# Patient Record
Sex: Male | Born: 1975 | Race: White | Hispanic: No | Marital: Single | Smoking: Never smoker
Health system: Southern US, Community
[De-identification: ages and names within clinical notes are randomized; demographics above are authoritative.]

## PROBLEM LIST (undated history)

## (undated) DIAGNOSIS — N200 Calculus of kidney: Secondary | ICD-10-CM

---

## 2021-12-17 ENCOUNTER — Emergency Department
Admission: EM | Admit: 2021-12-17 | Discharge: 2021-12-17 | Disposition: A | Discharge status: Home / Self Care | Payer: Commercial Managed Care - PPO | Attending: Emergency Medicine | Admitting: Emergency Medicine

## 2021-12-17 ENCOUNTER — Other Ambulatory Visit: Payer: Self-pay

## 2021-12-17 ENCOUNTER — Emergency Department: Payer: Commercial Managed Care - PPO

## 2021-12-17 DIAGNOSIS — N202 Calculus of kidney with calculus of ureter: Secondary | ICD-10-CM | POA: Diagnosis not present

## 2021-12-17 DIAGNOSIS — R1032 Left lower quadrant pain: Secondary | ICD-10-CM | POA: Diagnosis present

## 2021-12-17 DIAGNOSIS — N200 Calculus of kidney: Secondary | ICD-10-CM

## 2021-12-17 HISTORY — DX: Calculus of kidney: N20.0

## 2021-12-17 LAB — COMPREHENSIVE METABOLIC PANEL
ALT: 32 U/L (ref 0–44)
AST: 19 U/L (ref 15–41)
Albumin: 4.5 g/dL (ref 3.5–5.0)
Alkaline Phosphatase: 73 U/L (ref 38–126)
Anion gap: 9 (ref 5–15)
BUN: 29 mg/dL — ABNORMAL HIGH (ref 6–20)
CO2: 24 mmol/L (ref 22–32)
Calcium: 9 mg/dL (ref 8.9–10.3)
Chloride: 105 mmol/L (ref 98–111)
Creatinine, Ser: 1.04 mg/dL (ref 0.61–1.24)
GFR, Estimated: >60 mL/min (ref >60)
Glucose, Bld: 185 mg/dL — ABNORMAL HIGH (ref 70–99)
Potassium: 3.3 mmol/L — ABNORMAL LOW (ref 3.5–5.1)
Sodium: 138 mmol/L (ref 135–145)
Total Bilirubin: 1.9 mg/dL — ABNORMAL HIGH (ref 0.3–1.2)
Total Protein: 7.4 g/dL (ref 6.5–8.1)

## 2021-12-17 LAB — CBC WITH DIFFERENTIAL/PLATELET
Abs Immature Granulocytes: 0.12 10*3/uL — ABNORMAL HIGH (ref 0.00–0.07)
Basophils Absolute: 0.1 10*3/uL (ref 0.0–0.1)
Basophils Relative: 0 %
Eosinophils Absolute: 0.1 10*3/uL (ref 0.0–0.5)
Eosinophils Relative: 1 %
HCT: 47.6 % (ref 39.0–52.0)
Hemoglobin: 16.5 g/dL (ref 13.0–17.0)
Immature Granulocytes: 1 %
Lymphocytes Relative: 11 %
Lymphs Abs: 1.7 10*3/uL (ref 0.7–4.0)
MCH: 29 pg (ref 26.0–34.0)
MCHC: 34.7 g/dL (ref 30.0–36.0)
MCV: 83.8 fL (ref 80.0–100.0)
Monocytes Absolute: 0.5 10*3/uL (ref 0.1–1.0)
Monocytes Relative: 4 %
Neutro Abs: 12.7 10*3/uL — ABNORMAL HIGH (ref 1.7–7.7)
Neutrophils Relative %: 83 %
Platelets: 358 10*3/uL (ref 150–400)
RBC: 5.68 MIL/uL (ref 4.22–5.81)
RDW: 12.7 % (ref 11.5–15.5)
WBC: 15.3 10*3/uL — ABNORMAL HIGH (ref 4.0–10.5)
nRBC: 0 % (ref 0.0–0.2)

## 2021-12-17 LAB — URINALYSIS, ROUTINE W REFLEX MICROSCOPIC
Bacteria, UA: NONE SEEN
Bilirubin Urine: NEGATIVE
Glucose, UA: NEGATIVE mg/dL
Ketones, ur: NEGATIVE mg/dL
Leukocytes,Ua: NEGATIVE
Nitrite: NEGATIVE
Protein, ur: NEGATIVE mg/dL
Specific Gravity, Urine: >1.046 — ABNORMAL HIGH (ref 1.005–1.030)
pH: 5 (ref 5.0–8.0)

## 2021-12-17 LAB — TYPE AND SCREEN
ABO/RH(D): A POS
Antibody Screen: NEGATIVE

## 2021-12-17 LAB — LACTIC ACID, PLASMA
Lactic Acid, Venous: 2.1 mmol/L (ref 0.5–1.9)
Lactic Acid, Venous: 1.8 mmol/L (ref 0.5–1.9)

## 2021-12-17 MED ORDER — IBUPROFEN 600 MG PO TABS: 600.0000 mg | ORAL_TABLET | Freq: Three times a day (TID) | ORAL | 0 refills | Status: AC | PRN

## 2021-12-17 MED ORDER — ONDANSETRON HCL 4 MG/2ML IJ SOLN
4.0000 mg | Freq: Once | INTRAMUSCULAR | Status: AC
  Administered 2021-12-17: 4 mg via INTRAVENOUS
  Filled 2021-12-17: qty 2

## 2021-12-17 MED ORDER — HYDROMORPHONE HCL 1 MG/ML IJ SOLN
1.0000 mg | Freq: Once | INTRAMUSCULAR | Status: AC
Start: 2021-12-17 — End: 2021-12-17
  Administered 2021-12-17: 1 mg via INTRAVENOUS
  Filled 2021-12-17: qty 1

## 2021-12-17 MED ORDER — ONDANSETRON 4 MG PO TBDP: 4.0000 mg | ORAL_TABLET | Freq: Three times a day (TID) | ORAL | 0 refills | Status: AC | PRN

## 2021-12-17 MED ORDER — IOHEXOL 350 MG/ML SOLN
100.0000 mL | Freq: Once | INTRAVENOUS | Status: AC | PRN
  Administered 2021-12-17: 100 mL via INTRAVENOUS
  Filled 2021-12-17: qty 100

## 2021-12-17 MED ORDER — SODIUM CHLORIDE 0.9 % IV BOLUS
1000.0000 mL | Freq: Once | INTRAVENOUS | Status: AC
  Administered 2021-12-17: 1000 mL via INTRAVENOUS

## 2021-12-17 MED ORDER — KETOROLAC TROMETHAMINE 30 MG/ML IJ SOLN
15.0000 mg | Freq: Once | INTRAMUSCULAR | Status: AC
  Administered 2021-12-17: 15 mg via INTRAVENOUS
  Filled 2021-12-17: qty 1

## 2021-12-17 MED ORDER — HYDROCODONE-ACETAMINOPHEN 5-325 MG PO TABS: 1.0000 | ORAL_TABLET | Freq: Four times a day (QID) | ORAL | 0 refills | Status: AC | PRN

## 2021-12-17 NOTE — ED Provider Notes (Signed)
? ?[facility] ?Provider Note ? ? ? Event Date/Time  ? First MD Initiated Contact with Patient 12/17/21 1251   ?  (approximate) ? ? ?History  ? ?Abdominal Pain (C/o sudden pain to LLQ while having BM, pt receive of fentanyl and 4 mg zofran, pt rating pain 10/10) ? ? ?HPI ? ?Alexander Zavala is a 46 y.o. male here with acute, severe lower abdominal pain.  The patient states he was in his usual state of health until this afternoon, when he experienced acute, severe, left lower quadrant abdominal pain.  It immediately became 10 of 10 severity.  He had associated nausea and diaphoresis.  Pain is severe, the worst he has felt.  Denies any testicular pain or swelling.  No urinary symptoms.  No recent fevers or chills.  No recent trauma. ?  ? ? ?Physical Exam  ? ?Triage Vital Signs: ?ED Triage Vitals  ?Enc Vitals Group  ?   BP --   ?   Pulse --   ?   Resp --   ?   Temp --   ?   Temp src --   ?   SpO2 12/17/21 1244 96 %  ?   Weight 12/17/21 1247 175 lb (79.4 kg)  ?   Height 12/17/21 1247 5\' 9"  (1.753 m)  ?   Head Circumference --   ?   Peak Flow --   ?   Pain Score 12/17/21 1246 10  ?   Pain Loc --   ?   Pain Edu? --   ?   Excl. in GC? --   ? ? ?Most recent vital signs: ?Vitals:  ? 12/17/21 1244 12/17/21 1623  ?BP: (!) 162/101 115/81  ?Pulse: 90 75  ?Resp: (!) 24 18  ?Temp: (!) 97.4 ?F (36.3 ?C) 97.8 ?F (36.6 ?C)  ?SpO2: 96% 95%  ? ? ? ?General: Awake, appears very uncomfortable and in pain. ?CV:  Good peripheral perfusion.  Regular rate and rhythm. ?Resp:  Normal effort.  Lungs clear to auscultation bilaterally. ?Abd:  No distention.  ?Other:  Distressed, significant left lower quadrant tenderness. ? ? ?ED Results / Procedures / Treatments  ? ?Labs ?(all labs ordered are listed, but only abnormal results are displayed) ?Labs Reviewed  ?CBC WITH DIFFERENTIAL/PLATELET - Abnormal; Notable for the following components:  ?    Result Value  ? WBC 15.3 (*)   ? Neutro Abs 12.7 (*)   ? Abs Immature  Granulocytes 0.12 (*)   ? All other components within normal limits  ?COMPREHENSIVE METABOLIC PANEL - Abnormal; Notable for the following components:  ? Potassium 3.3 (*)   ? Glucose, Bld 185 (*)   ? BUN 29 (*)   ? Total Bilirubin 1.9 (*)   ? All other components within normal limits  ?LACTIC ACID, PLASMA - Abnormal; Notable for the following components:  ? Lactic Acid, Venous 2.1 (*)   ? All other components within normal limits  ?URINALYSIS, ROUTINE W REFLEX MICROSCOPIC - Abnormal; Notable for the following components:  ? Color, Urine YELLOW (*)   ? APPearance CLEAR (*)   ? Specific Gravity, Urine >1.046 (*)   ? Hgb urine dipstick LARGE (*)   ? All other components within normal limits  ?LACTIC ACID, PLASMA  ?TYPE AND SCREEN  ? ? ? ?EKG ? ? ? ?RADIOLOGY ?CT dissection study: No dissection, minimal left ureteral dilatation with small calculus in the urinary bladder ? ? ?I also independently reviewed and  agree with radiologist interpretations. ? ? ?PROCEDURES: ? ?Critical Care performed: No ? ? ? ?MEDICATIONS ORDERED IN ED: ?Medications  ?HYDROmorphone (DILAUDID) injection 1 mg (1 mg Intravenous Given 12/17/21 1259)  ?ondansetron (ZOFRAN) injection 4 mg (4 mg Intravenous Given 12/17/21 1259)  ?iohexol (OMNIPAQUE) 350 MG/ML injection 100 mL (100 mLs Intravenous Contrast Given 12/17/21 1415)  ?sodium chloride 0.9 % bolus 1,000 mL (0 mLs Intravenous Stopped 12/17/21 1617)  ?ketorolac (TORADOL) 30 MG/ML injection 15 mg (15 mg Intravenous Given 12/17/21 1517)  ? ? ? ?IMPRESSION / MDM / ASSESSMENT AND PLAN / ED COURSE  ?I reviewed the triage vital signs and the nursing notes. ?             ?               ? ? ?Ddx:  ?Aortic dissection, kidney stone, obstruction, volvulus, diverticulitis, colitis, musculoskeletal pain, radiculopathy ? ? ?MDM:  ?46 year old male with no significant past medical history here with severe left lower quadrant pain.  Patient arrives significantly hypertensive, diaphoretic, and in distress.  States  this feels very different from his kidney stone.  Patient was subsequently sent for emergent CT dissection protocol, given his marked hypertension and atypical symptoms, which fortunately is negative for dissection but does show what appears to be a likely passed stone.  His pain is markedly improved after analgesia and fluids.  Lab work shows moderate leukocytosis which I suspect is reactive.  He is adamant he has had no fevers or chills or infectious symptoms.  CMP is unremarkable with exception of possible mild dehydration with elevated BUN to creatinine ratio.  Lactate 2.1, improved with some fluids.  Urinalysis shows no evidence of UTI.  He has no bacteria.  6010 white blood cells but he has no ongoing symptoms.  This, along with what appears to be a now passed stone, makes UTI/infected stone unlikely.  We will plan to treat for what I suspect was left renal colic secondary to stone which now appears to be passed.  Will give brief analgesics as needed in the event of ongoing pain.  Otherwise, return precautions given. ? ? ?MEDICATIONS GIVEN IN ED: ?Medications  ?HYDROmorphone (DILAUDID) injection 1 mg (1 mg Intravenous Given 12/17/21 1259)  ?ondansetron (ZOFRAN) injection 4 mg (4 mg Intravenous Given 12/17/21 1259)  ?iohexol (OMNIPAQUE) 350 MG/ML injection 100 mL (100 mLs Intravenous Contrast Given 12/17/21 1415)  ?sodium chloride 0.9 % bolus 1,000 mL (0 mLs Intravenous Stopped 12/17/21 1617)  ?ketorolac (TORADOL) 30 MG/ML injection 15 mg (15 mg Intravenous Given 12/17/21 1517)  ? ? ? ?Consults:  ? ? ? ?EMR reviewed  ?None available ? ? ? ? ?FINAL CLINICAL IMPRESSION(S) / ED DIAGNOSES  ? ?Final diagnoses:  ?Nephrolithiasis  ? ? ? ?Rx / DC Orders  ? ?ED Discharge Orders   ? ?      Ordered  ?  ibuprofen (ADVIL) 600 MG tablet  Every 8 hours PRN       ? 12/17/21 1608  ?  ondansetron (ZOFRAN-ODT) 4 MG disintegrating tablet  Every 8 hours PRN       ? 12/17/21 1608  ?  HYDROcodone-acetaminophen (NORCO) 5-325 MG tablet   Every 6 hours PRN       ? 12/17/21 1608  ? ?  ?  ? ?  ? ? ? ?Note:  This document was prepared using Dragon voice recognition software and may include unintentional dictation errors. ?  ?Shaune Pollack, MD ?12/17/21 1814 ? ?

## 2021-12-17 NOTE — ED Notes (Signed)
Pt A&O, IV removed, pt given discharge instructions, follow-up and medications explained, pt able to ambulate with steady gait. ?

## 2023-04-25 IMAGING — CT CT ANGIO CHEST-ABD-PELV FOR DISSECTION W/ AND WO/W CM
2 of 7 series · 14 of 46 positions shown, 16 images · IV contrast (APPLIED)
Comparison: None.

CLINICAL DATA: Right lower quadrant abdominal pain. Acute aortic
syndrome.

EXAM:
CT ANGIOGRAPHY CHEST, ABDOMEN AND PELVIS
TECHNIQUE: Non-contrast CT of the chest was initially obtained.

[Series 7: arterial · axial · arterial · 0.86mm/px · z∈[-947,-279]mm · 11 of 374 slices shown, 13 images]
[im 20/374  soft-tissue]
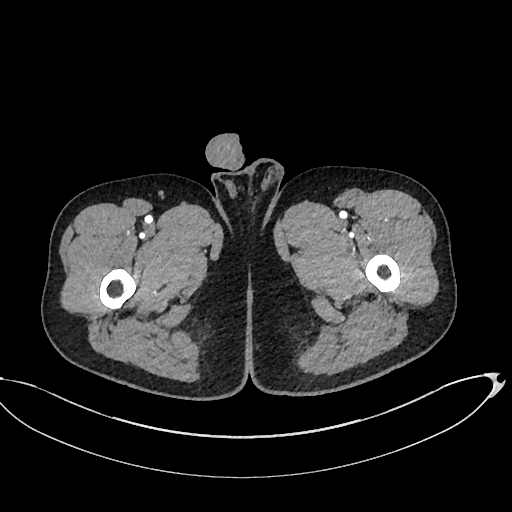
[im 20/374  bone]
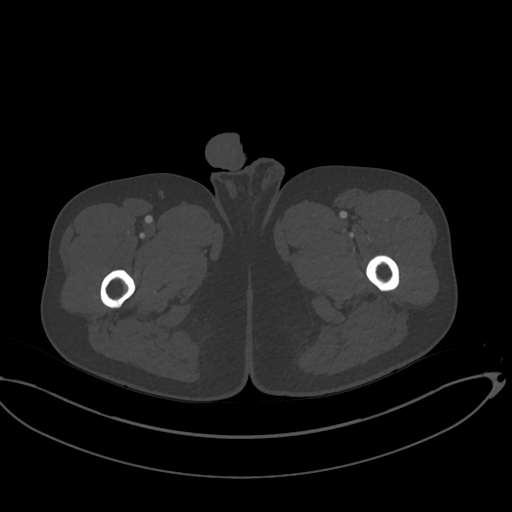
[im 59/374  soft-tissue]
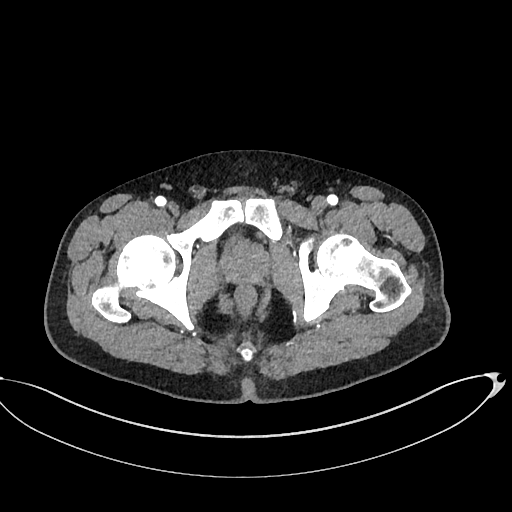
[im 99/374  soft-tissue]
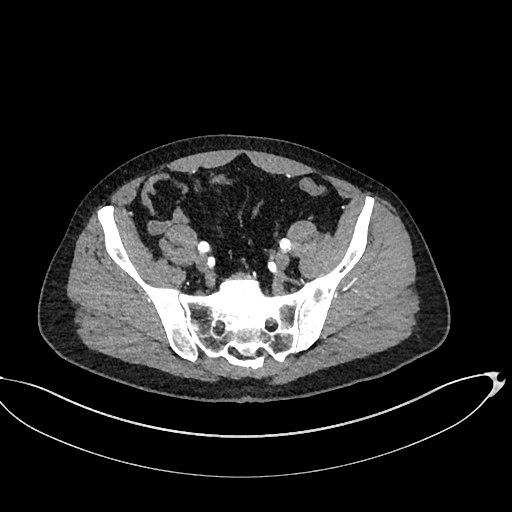
[im 118/374  soft-tissue]
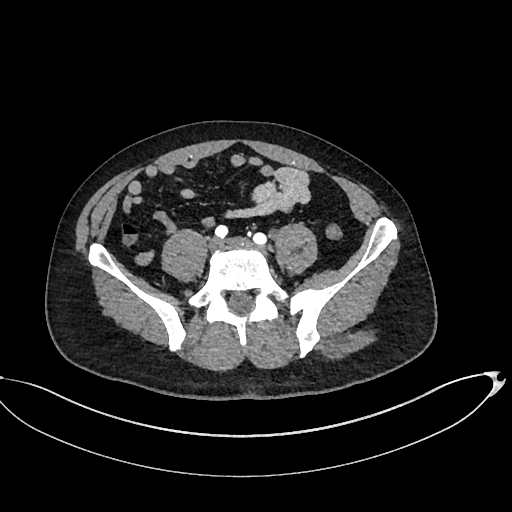
[im 158/374  soft-tissue]
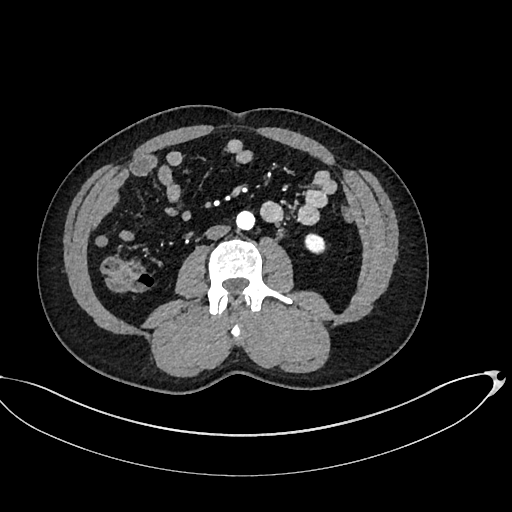
[im 197/374  soft-tissue]
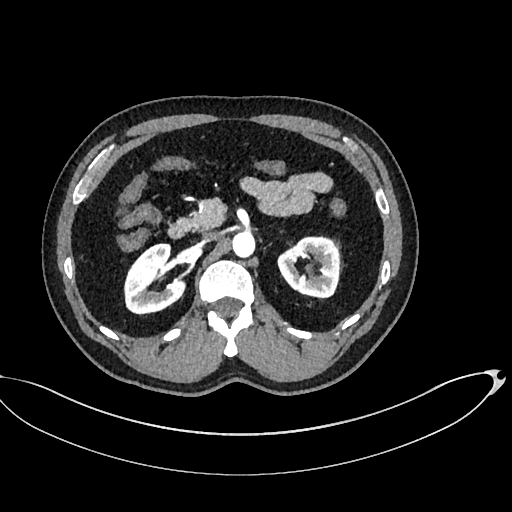
[im 216/374  soft-tissue]
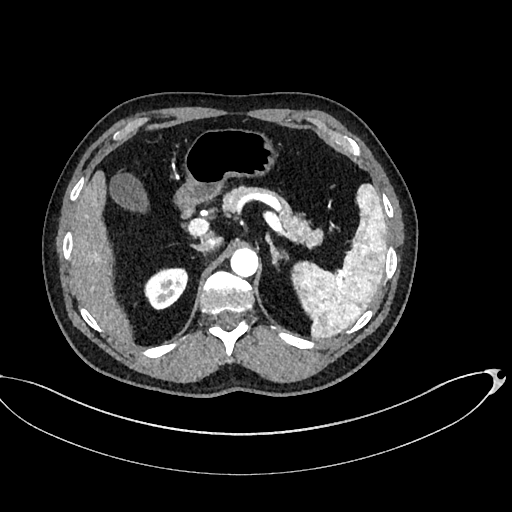
[im 256/374  soft-tissue]
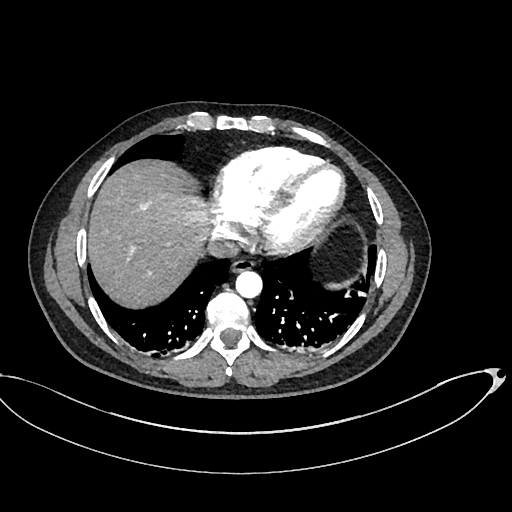
[im 275/374  soft-tissue]
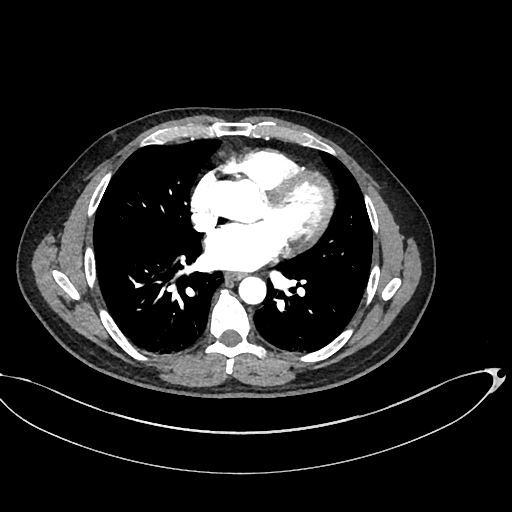
[im 275/374  bone]
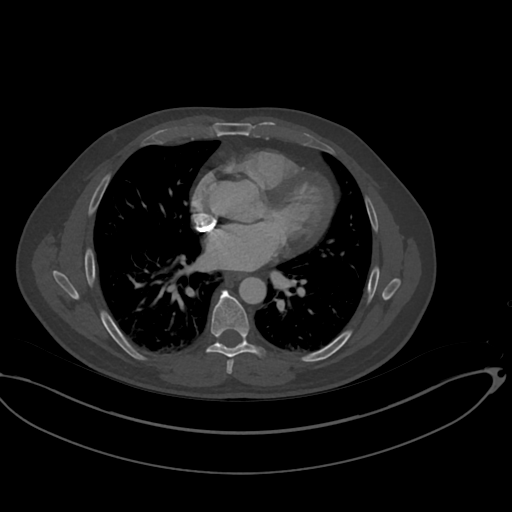
[im 315/374  soft-tissue]
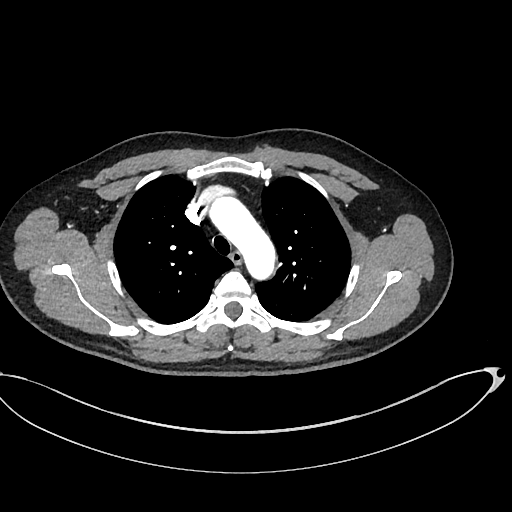
[im 354/374  soft-tissue]
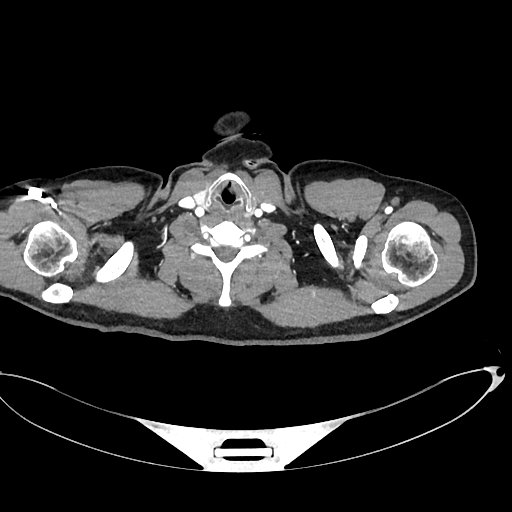

[Series 10: cor · coronal · 1.09mm/px · 3 of 151 slices shown]
[im 38/151  soft-tissue]
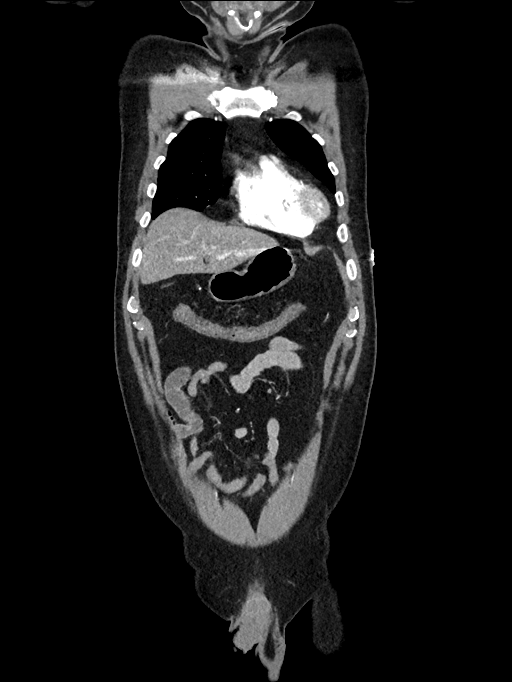
[im 76/151  soft-tissue]
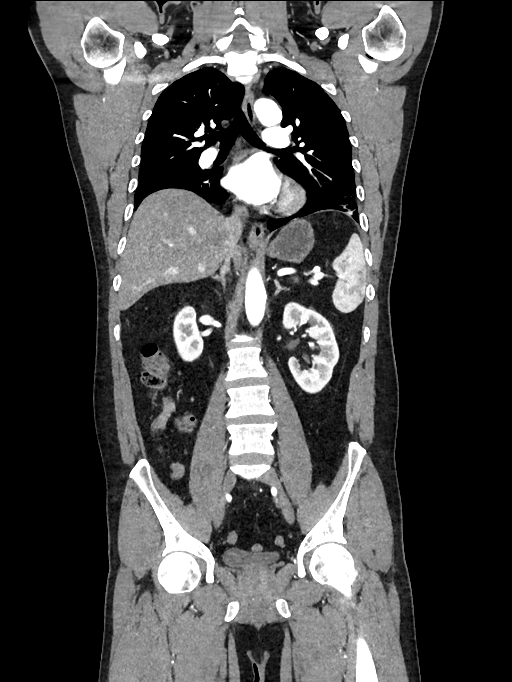
[im 113/151  soft-tissue]
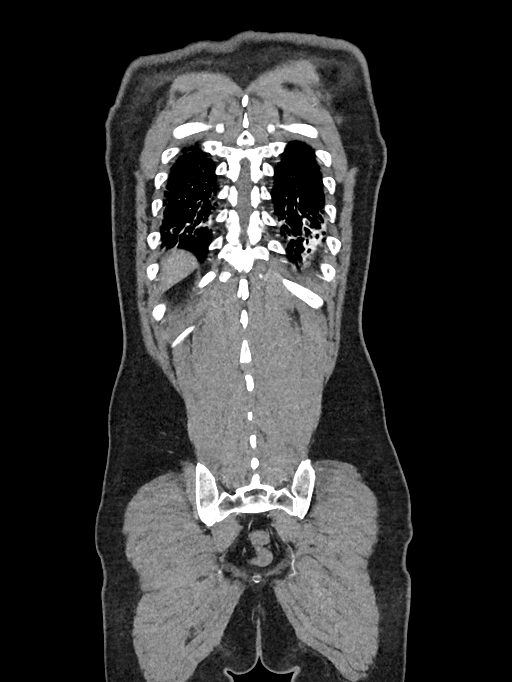

[14 of 46 positions shown; findings below may reference images not displayed]

Multidetector CT imaging through the chest, abdomen and pelvis was
performed using the standard protocol during bolus administration of
intravenous contrast. Multiplanar reconstructed images and MIPs were
obtained and reviewed to evaluate the vascular anatomy.

RADIATION DOSE REDUCTION: This exam was performed according to the
departmental dose-optimization program which includes automated
exposure control, adjustment of the mA and/or kV according to
patient size and/or use of iterative reconstruction technique.

CONTRAST:  100mL OMNIPAQUE IOHEXOL 350 MG/ML SOLN
FINDINGS: CTA CHEST FINDINGS

Cardiovascular: Preferential opacification of the thoracic aorta. No
evidence of thoracic aortic aneurysm or dissection. Normal heart
size. No pericardial effusion.

Mediastinum/Nodes: No enlarged mediastinal, hilar, or axillary lymph
nodes. Thyroid gland, trachea, and esophagus demonstrate no
significant findings.

Lungs/Pleura: No pneumothorax or pleural effusion is noted. Mild
bibasilar subsegmental atelectasis is noted, left greater than
right.

Musculoskeletal: No chest wall abnormality. No acute or significant
osseous findings.

Review of the MIP images confirms the above findings.

CTA ABDOMEN AND PELVIS FINDINGS

VASCULAR

Aorta: Normal caliber aorta without aneurysm, dissection, vasculitis
or significant stenosis.

Celiac: Patent without evidence of aneurysm, dissection, vasculitis
or significant stenosis.

SMA: Patent without evidence of aneurysm, dissection, vasculitis or
significant stenosis.

Renals: Bilateral renal arteries are patent without evidence of
aneurysm, dissection, vasculitis, fibromuscular dysplasia or
significant stenosis.

IMA: Patent without evidence of aneurysm, dissection, vasculitis or
significant stenosis.

Inflow: Patent without evidence of aneurysm, dissection, vasculitis
or significant stenosis.

Veins: No obvious venous abnormality within the limitations of this
arterial phase study.

Review of the MIP images confirms the above findings.

NON-VASCULAR

Hepatobiliary: Hepatic steatosis. No gallstones or biliary
dilatation is noted.

Pancreas: Unremarkable. No pancreatic ductal dilatation or
surrounding inflammatory changes.

Spleen: Normal in size without focal abnormality.

Adrenals/Urinary Tract: Adrenal glands appear normal. Minimal left
ureteral dilatation is noted, with small calculus seen in dependent
portion of the urinary bladder, suggesting recently passed stone.

Stomach/Bowel: Stomach is within normal limits. Appendix appears
normal. No evidence of bowel wall thickening, distention, or
inflammatory changes.

Lymphatic: No adenopathy is noted.

Reproductive: Prostate is unremarkable.

Other: No abdominal wall hernia or abnormality. No abdominopelvic
ascites.

Musculoskeletal: No acute or significant osseous findings.

Review of the MIP images confirms the above findings.
IMPRESSION: There is no evidence of thoracic or abdominal aortic dissection or
aneurysm.

There is no evidence of significant mesenteric or renal artery
stenosis.

Minimal left ureteral dilatation is noted with small calculus seen
in the dependent portion of the urinary bladder, suggesting recently
passed stone.

Probable hepatic steatosis.
